# Patient Record
Sex: Female | Born: 1965 | Race: Black or African American | Hispanic: No
Health system: Southern US, Community
[De-identification: ages and names within clinical notes are randomized; demographics above are authoritative.]

---

## 2009-05-20 ENCOUNTER — Ambulatory Visit: Payer: Self-pay | Admitting: Family Medicine

## 2009-09-01 ENCOUNTER — Ambulatory Visit: Payer: Self-pay | Admitting: Family Medicine

## 2009-09-19 ENCOUNTER — Ambulatory Visit: Payer: Self-pay | Admitting: Family Medicine

## 2009-11-14 ENCOUNTER — Ambulatory Visit: Payer: Self-pay | Admitting: Orthopedic Surgery

## 2010-01-22 ENCOUNTER — Ambulatory Visit: Payer: Self-pay | Admitting: Orthopedic Surgery

## 2010-01-31 ENCOUNTER — Inpatient Hospital Stay: Payer: Self-pay | Admitting: Orthopedic Surgery

## 2010-03-07 ENCOUNTER — Ambulatory Visit: Payer: Self-pay | Admitting: General Surgery

## 2010-04-19 ENCOUNTER — Inpatient Hospital Stay: Payer: Self-pay | Admitting: Orthopedic Surgery

## 2010-05-16 ENCOUNTER — Encounter: Payer: Self-pay | Admitting: Orthopedic Surgery

## 2010-05-24 ENCOUNTER — Encounter: Payer: Self-pay | Admitting: Orthopedic Surgery

## 2010-09-03 ENCOUNTER — Ambulatory Visit: Payer: Self-pay | Admitting: Surgery

## 2010-09-04 LAB — PATHOLOGY REPORT

## 2010-10-29 ENCOUNTER — Ambulatory Visit: Payer: Self-pay | Admitting: Surgery

## 2010-12-11 ENCOUNTER — Ambulatory Visit: Payer: Self-pay | Admitting: Surgery

## 2010-12-21 ENCOUNTER — Ambulatory Visit: Payer: Self-pay | Admitting: Surgery

## 2010-12-24 LAB — PATHOLOGY REPORT

## 2011-01-07 ENCOUNTER — Ambulatory Visit: Payer: Self-pay | Admitting: Surgery

## 2011-01-17 ENCOUNTER — Ambulatory Visit: Payer: Self-pay

## 2011-06-20 ENCOUNTER — Ambulatory Visit: Payer: Self-pay | Admitting: Podiatry

## 2011-07-12 ENCOUNTER — Ambulatory Visit: Payer: Self-pay | Admitting: Podiatry

## 2011-09-03 ENCOUNTER — Ambulatory Visit: Payer: Self-pay | Admitting: Family Medicine

## 2011-09-08 IMAGING — CR DG SHOULDER 3+V*R*
1 series · 3 of 3 positions shown · non-contrast
Comparison: none

REASON FOR EXAM: right osteoarthrititis
COMMENTS:

PROCEDURE:     DXR - DXR SHOULDER RIGHT COMPLETE  - January 17, 2011 [DATE]
RESULT:     Images of the right shoulder demonstrate hypertrophic spurring.
There is no definite fracture, dislocation or foreign body.

[Series 1: view not recorded · 0.17mm/px · 3 of 3 slices shown]
[im 1/3]
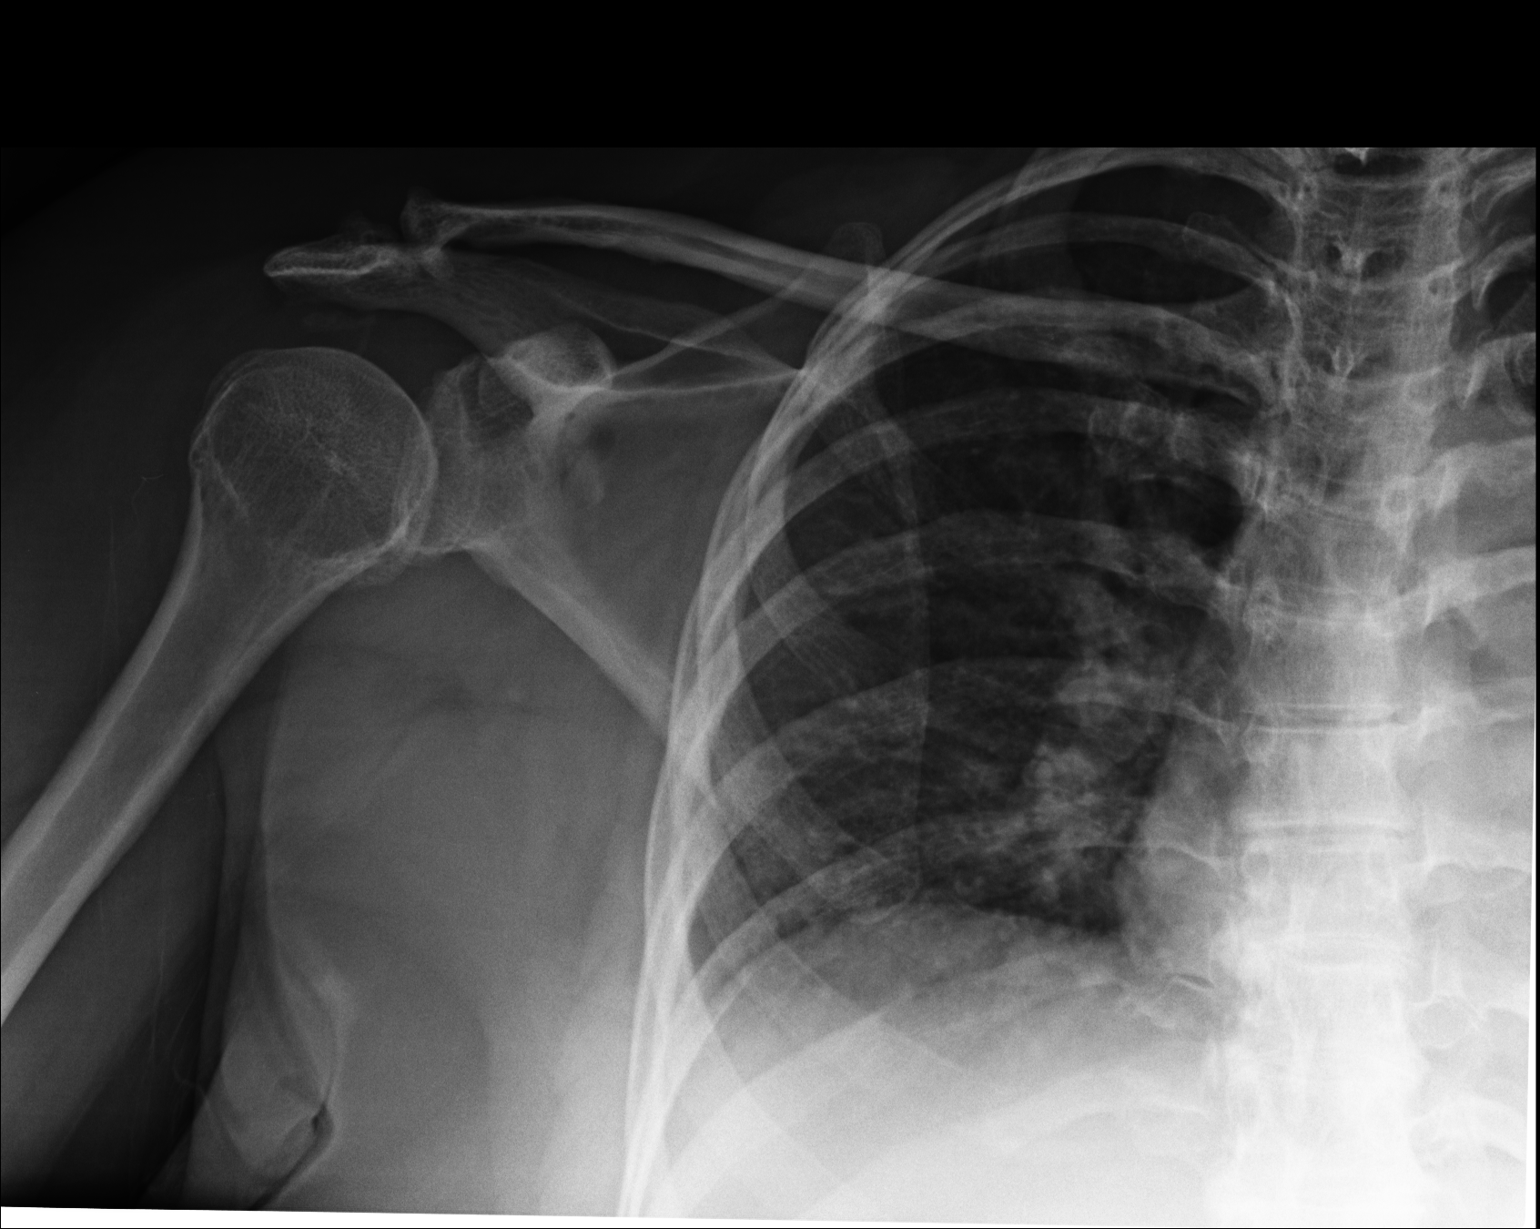
[im 2/3]
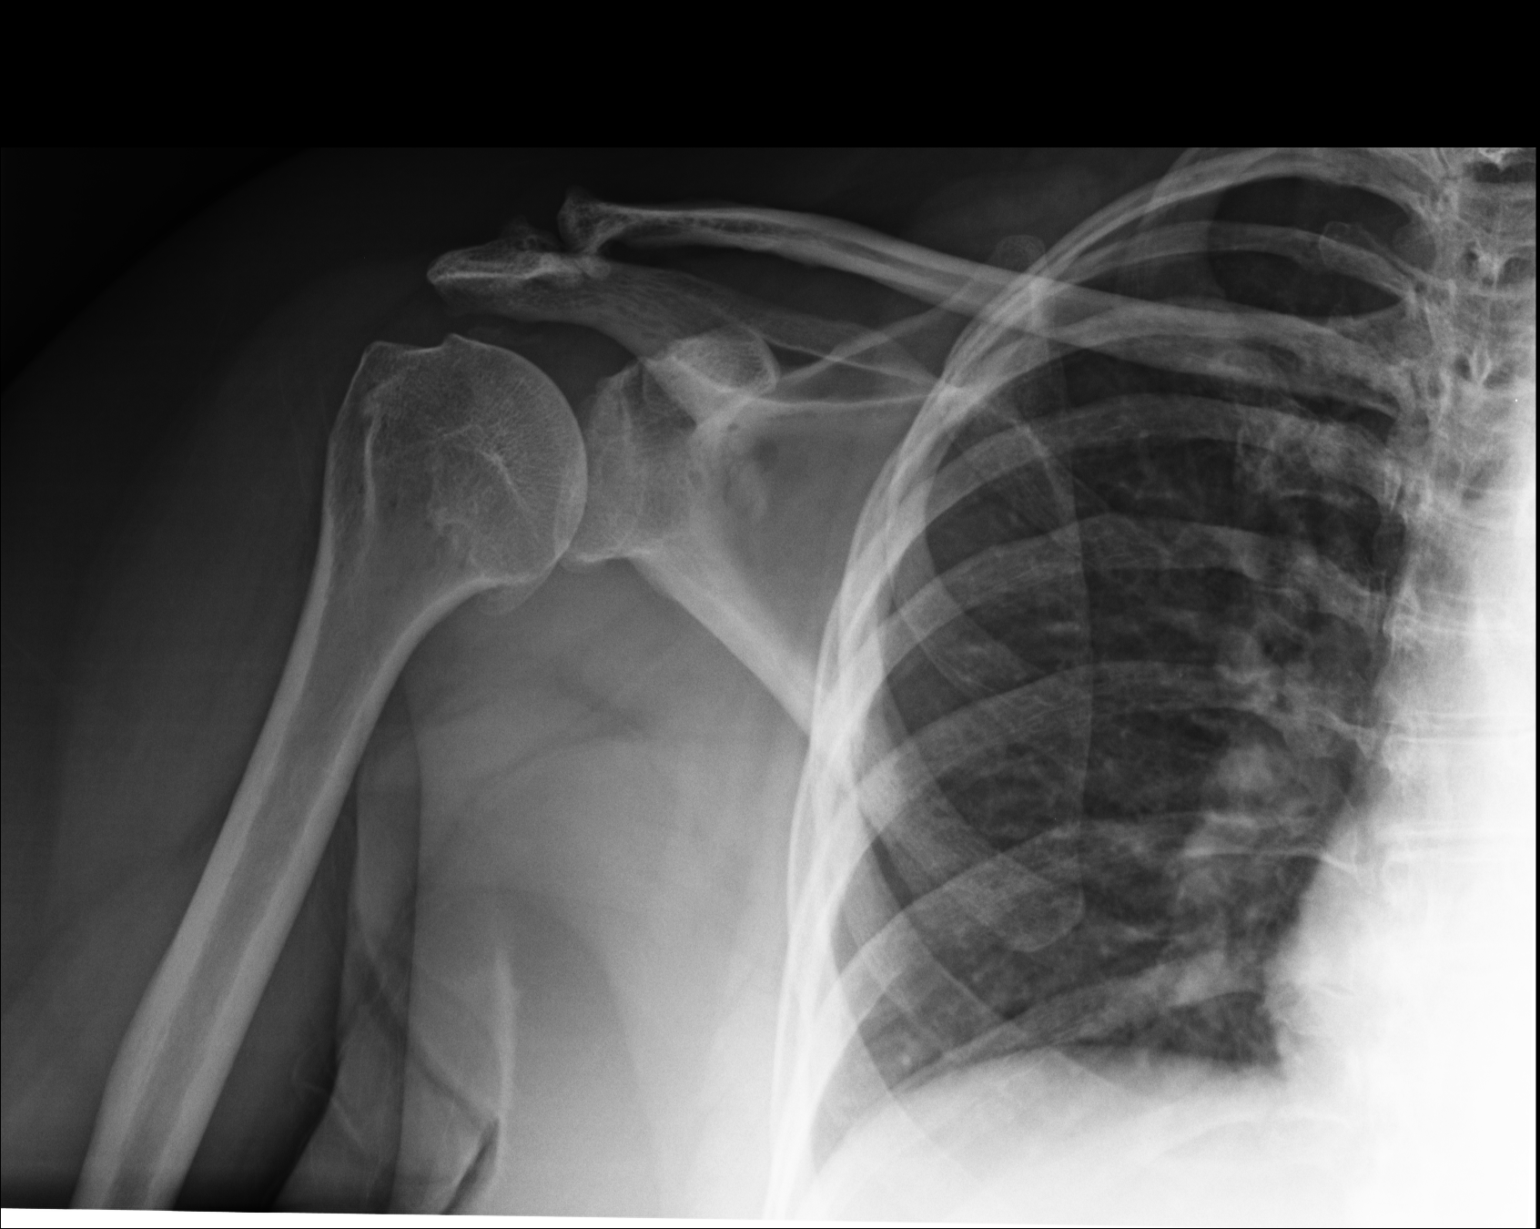
[im 3/3]
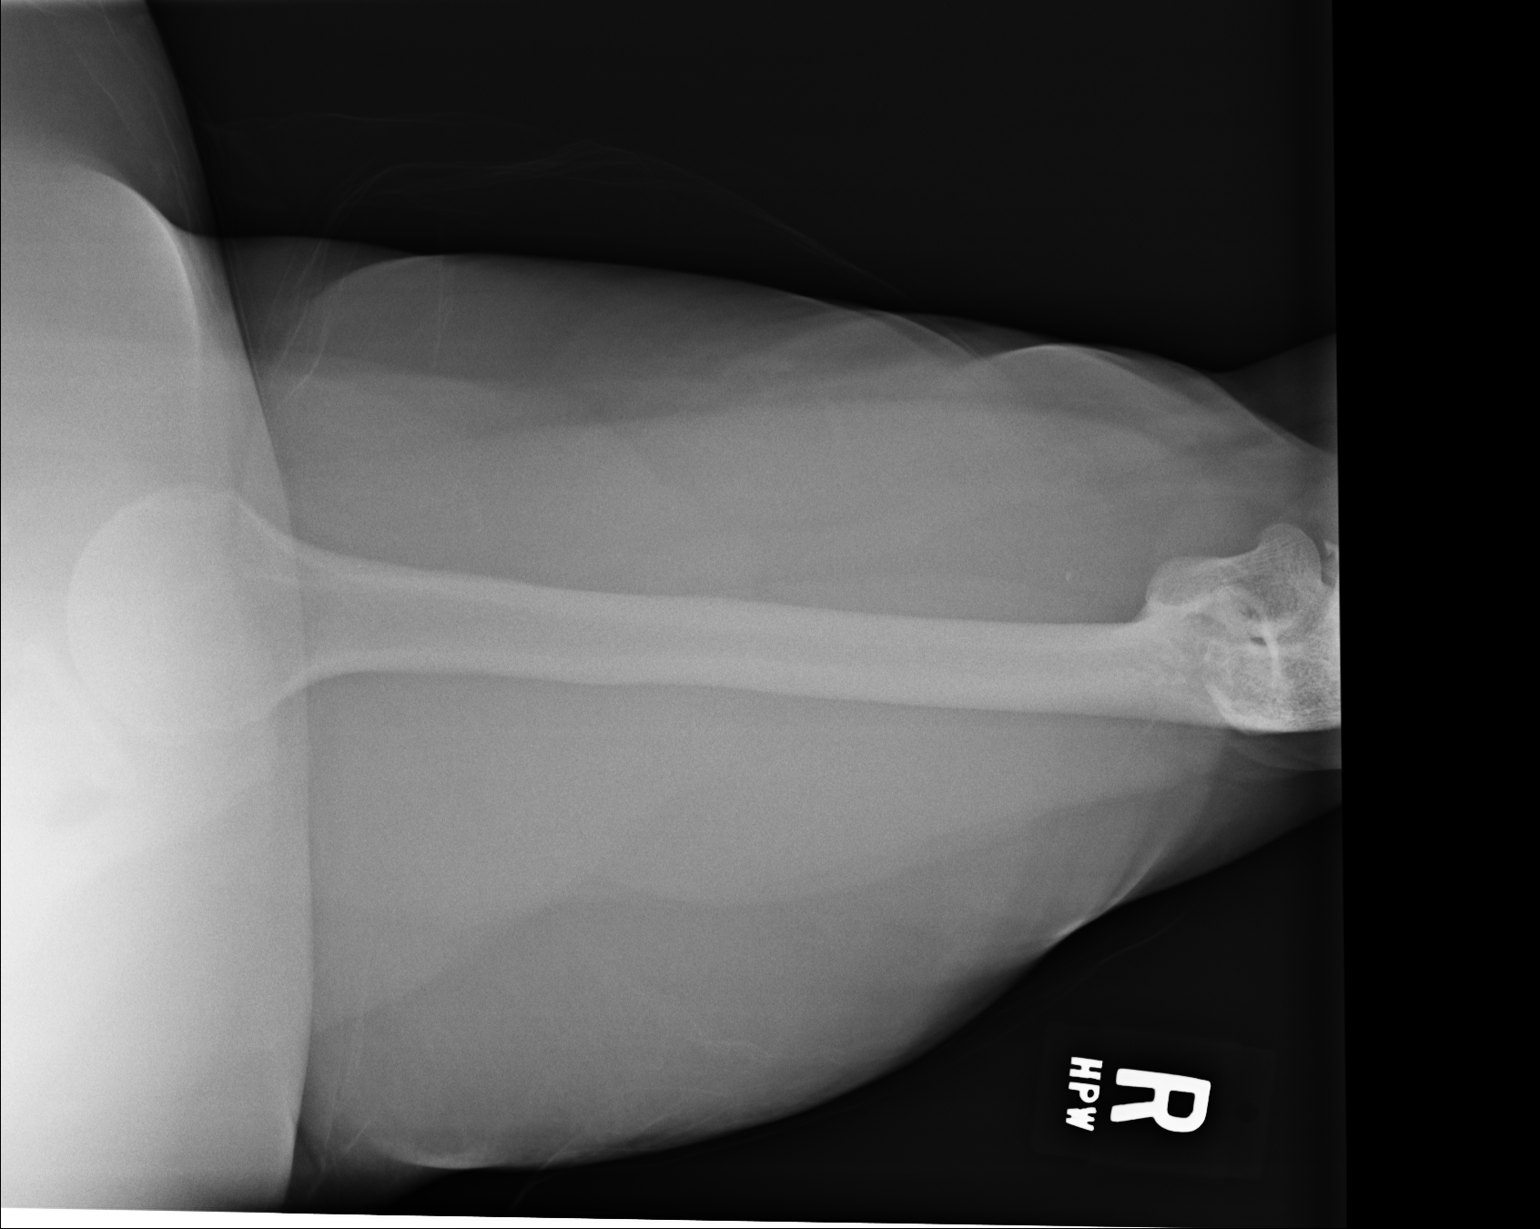

[3 of 3 positions shown; findings below may reference images not displayed]

IMPRESSION: Degenerative changes of the right shoulder and right
acromioclavicular joint.

## 2011-09-09 ENCOUNTER — Encounter: Payer: Self-pay | Admitting: Orthopedic Surgery

## 2011-09-24 ENCOUNTER — Encounter: Payer: Self-pay | Admitting: Orthopedic Surgery

## 2011-10-25 ENCOUNTER — Encounter: Payer: Self-pay | Admitting: Orthopedic Surgery

## 2011-10-28 ENCOUNTER — Other Ambulatory Visit: Payer: Self-pay | Admitting: Pain Medicine

## 2011-10-28 ENCOUNTER — Ambulatory Visit: Payer: Self-pay | Admitting: Pain Medicine

## 2011-10-28 LAB — SEDIMENTATION RATE: Erythrocyte Sed Rate: 42 mm/hr — ABNORMAL HIGH (ref 0–20)

## 2011-11-20 ENCOUNTER — Ambulatory Visit: Payer: Self-pay | Admitting: Pain Medicine

## 2011-11-22 ENCOUNTER — Encounter: Payer: Self-pay | Admitting: Orthopedic Surgery

## 2011-12-05 ENCOUNTER — Ambulatory Visit: Payer: Self-pay | Admitting: Pain Medicine

## 2011-12-11 ENCOUNTER — Ambulatory Visit: Payer: Self-pay | Admitting: Pain Medicine

## 2011-12-19 ENCOUNTER — Ambulatory Visit: Payer: Self-pay | Admitting: Pain Medicine

## 2011-12-23 ENCOUNTER — Encounter: Payer: Self-pay | Admitting: Orthopedic Surgery

## 2012-01-02 ENCOUNTER — Ambulatory Visit: Payer: Self-pay | Admitting: Family Medicine

## 2012-01-06 ENCOUNTER — Ambulatory Visit: Payer: Self-pay | Admitting: Pain Medicine

## 2012-01-09 ENCOUNTER — Ambulatory Visit: Payer: Self-pay | Admitting: Pain Medicine

## 2012-01-14 ENCOUNTER — Ambulatory Visit: Payer: Self-pay | Admitting: Family Medicine

## 2012-01-22 ENCOUNTER — Ambulatory Visit: Payer: Self-pay | Admitting: Family Medicine

## 2012-01-29 ENCOUNTER — Ambulatory Visit: Payer: Self-pay | Admitting: Pain Medicine

## 2012-01-29 ENCOUNTER — Other Ambulatory Visit: Payer: Self-pay | Admitting: Pain Medicine

## 2012-01-29 LAB — SEDIMENTATION RATE: Erythrocyte Sed Rate: 47 mm/hr — ABNORMAL HIGH (ref 0–20)

## 2012-02-03 ENCOUNTER — Ambulatory Visit: Payer: Self-pay | Admitting: Orthopedic Surgery

## 2012-02-03 LAB — BASIC METABOLIC PANEL
Anion Gap: 8 (ref 7–16)
Calcium, Total: 8.6 mg/dL (ref 8.5–10.1)
Chloride: 104 mmol/L (ref 98–107)
Co2: 26 mmol/L (ref 21–32)
Creatinine: 0.64 mg/dL (ref 0.60–1.30)
EGFR (African American): >60
Glucose: 120 mg/dL — ABNORMAL HIGH (ref 65–99)
Osmolality: 276 (ref 275–301)
Potassium: 4.4 mmol/L (ref 3.5–5.1)
Sodium: 138 mmol/L (ref 136–145)

## 2012-02-03 LAB — CBC
HCT: 35.9 % (ref 35.0–47.0)
HGB: 11.7 g/dL — ABNORMAL LOW (ref 12.0–16.0)
MCH: 27.2 pg (ref 26.0–34.0)
RBC: 4.32 10*6/uL (ref 3.80–5.20)
RDW: 21.6 % — ABNORMAL HIGH (ref 11.5–14.5)
WBC: 7 10*3/uL (ref 3.6–11.0)

## 2012-02-03 LAB — URINALYSIS, COMPLETE
Ketone: NEGATIVE
Protein: NEGATIVE
Specific Gravity: 1.025 (ref 1.003–1.030)

## 2012-02-03 LAB — PROTIME-INR
INR: 0.9
Prothrombin Time: 12.5 secs (ref 11.5–14.7)

## 2012-02-13 ENCOUNTER — Inpatient Hospital Stay: Payer: Self-pay | Admitting: Orthopedic Surgery

## 2012-02-13 LAB — URINALYSIS, COMPLETE
Bacteria: NONE SEEN
Ketone: NEGATIVE
Leukocyte Esterase: NEGATIVE
Nitrite: NEGATIVE
Ph: 5 (ref 4.5–8.0)
Protein: NEGATIVE
RBC,UR: 1 /HPF (ref 0–5)
Specific Gravity: 1.026 (ref 1.003–1.030)
Squamous Epithelial: 1

## 2012-02-13 LAB — PREGNANCY, URINE: Pregnancy Test, Urine: NEGATIVE m[IU]/mL

## 2012-02-14 LAB — CBC WITH DIFFERENTIAL/PLATELET
Basophil %: 0.3 %
Eosinophil %: 0.2 %
HGB: 10.4 g/dL — ABNORMAL LOW (ref 12.0–16.0)
Lymphocyte %: 15.2 %
MCH: 26.7 pg (ref 26.0–34.0)
MCHC: 31.7 g/dL — ABNORMAL LOW (ref 32.0–36.0)
MCV: 84 fL (ref 80–100)
Monocyte #: 0.8 x10 3/mm (ref 0.2–0.9)
Monocyte %: 9.4 %
Platelet: 149 10*3/uL — ABNORMAL LOW (ref 150–440)
RBC: 3.89 10*6/uL (ref 3.80–5.20)
RDW: 20.6 % — ABNORMAL HIGH (ref 11.5–14.5)

## 2012-02-14 LAB — URINE CULTURE

## 2012-02-14 LAB — BASIC METABOLIC PANEL
BUN: 6 mg/dL — ABNORMAL LOW (ref 7–18)
Calcium, Total: 7.5 mg/dL — ABNORMAL LOW (ref 8.5–10.1)
Chloride: 102 mmol/L (ref 98–107)
Co2: 29 mmol/L (ref 21–32)
Creatinine: 0.59 mg/dL — ABNORMAL LOW (ref 0.60–1.30)
EGFR (African American): >60
EGFR (Non-African Amer.): >60
Glucose: 143 mg/dL — ABNORMAL HIGH (ref 65–99)
Osmolality: 278 (ref 275–301)
Potassium: 3.8 mmol/L (ref 3.5–5.1)
Sodium: 139 mmol/L (ref 136–145)

## 2012-02-15 LAB — CBC WITH DIFFERENTIAL/PLATELET
Basophil #: 0.1 10*3/uL (ref 0.0–0.1)
Eosinophil #: 0.1 10*3/uL (ref 0.0–0.7)
Eosinophil %: 1.3 %
HCT: 29.6 % — ABNORMAL LOW (ref 35.0–47.0)
Lymphocyte #: 1.6 10*3/uL (ref 1.0–3.6)
MCHC: 32.6 g/dL (ref 32.0–36.0)
MCV: 83 fL (ref 80–100)
Monocyte #: 0.9 x10 3/mm (ref 0.2–0.9)
Monocyte %: 10 %
Platelet: 143 10*3/uL — ABNORMAL LOW (ref 150–440)
RBC: 3.55 10*6/uL — ABNORMAL LOW (ref 3.80–5.20)

## 2012-02-16 LAB — CBC WITH DIFFERENTIAL/PLATELET
Basophil #: 0 10*3/uL (ref 0.0–0.1)
Eosinophil #: 0.1 10*3/uL (ref 0.0–0.7)
Eosinophil %: 1.2 %
HGB: 9.2 g/dL — ABNORMAL LOW (ref 12.0–16.0)
Lymphocyte %: 17 %
MCV: 83 fL (ref 80–100)
Neutrophil %: 72.3 %
Platelet: 151 10*3/uL (ref 150–440)
RBC: 3.38 10*6/uL — ABNORMAL LOW (ref 3.80–5.20)
WBC: 7.8 10*3/uL (ref 3.6–11.0)

## 2012-02-18 LAB — CREATININE, SERUM
Creatinine: 0.64 mg/dL (ref 0.60–1.30)
EGFR (African American): >60

## 2012-02-22 ENCOUNTER — Ambulatory Visit: Payer: Self-pay | Admitting: Family Medicine

## 2012-03-04 ENCOUNTER — Encounter: Payer: Self-pay | Admitting: Orthopedic Surgery

## 2012-03-23 ENCOUNTER — Encounter: Payer: Self-pay | Admitting: Orthopedic Surgery

## 2012-04-23 ENCOUNTER — Encounter: Payer: Self-pay | Admitting: Orthopedic Surgery

## 2012-04-28 ENCOUNTER — Ambulatory Visit: Payer: Self-pay | Admitting: Family Medicine

## 2012-05-06 ENCOUNTER — Ambulatory Visit: Payer: Self-pay | Admitting: Pain Medicine

## 2012-05-24 ENCOUNTER — Encounter: Payer: Self-pay | Admitting: Orthopedic Surgery

## 2012-05-24 ENCOUNTER — Ambulatory Visit: Payer: Self-pay | Admitting: Family Medicine

## 2012-05-28 ENCOUNTER — Ambulatory Visit: Payer: Self-pay | Admitting: Pain Medicine

## 2012-06-18 ENCOUNTER — Ambulatory Visit: Payer: Self-pay | Admitting: Podiatry

## 2012-09-11 ENCOUNTER — Ambulatory Visit: Payer: Self-pay | Admitting: Family Medicine

## 2012-10-05 ENCOUNTER — Encounter: Payer: Self-pay | Admitting: Podiatry

## 2012-10-24 ENCOUNTER — Encounter: Payer: Self-pay | Admitting: Podiatry

## 2012-11-26 ENCOUNTER — Ambulatory Visit: Payer: Self-pay | Admitting: Podiatry

## 2012-12-16 ENCOUNTER — Ambulatory Visit: Payer: Self-pay | Admitting: Gastroenterology

## 2012-12-17 ENCOUNTER — Ambulatory Visit: Payer: Self-pay | Admitting: Family Medicine

## 2012-12-22 ENCOUNTER — Ambulatory Visit: Payer: Self-pay | Admitting: Family Medicine

## 2012-12-23 ENCOUNTER — Ambulatory Visit: Payer: Self-pay | Admitting: Family Medicine

## 2013-01-21 ENCOUNTER — Ambulatory Visit: Payer: Self-pay | Admitting: Family Medicine

## 2013-03-15 ENCOUNTER — Ambulatory Visit: Payer: Self-pay | Admitting: Pain Medicine

## 2013-04-16 ENCOUNTER — Ambulatory Visit: Payer: Self-pay | Admitting: Physician Assistant

## 2013-05-18 ENCOUNTER — Ambulatory Visit: Payer: Self-pay | Admitting: Podiatry

## 2013-06-30 ENCOUNTER — Ambulatory Visit: Payer: Self-pay

## 2013-06-30 ENCOUNTER — Ambulatory Visit: Payer: Self-pay | Admitting: Gastroenterology

## 2013-08-14 IMAGING — CT CT HEAD WITHOUT CONTRAST
1 series · 16 of 30 positions shown, 20 images · non-contrast
Comparison: none

REASON FOR EXAM: HA
COMMENTS:

[Series 2: soft tissue · axial · 0.41mm/px · z∈[-26,+109]mm · 16 of 31 slices shown, 20 images]
[im 2/31  brain]
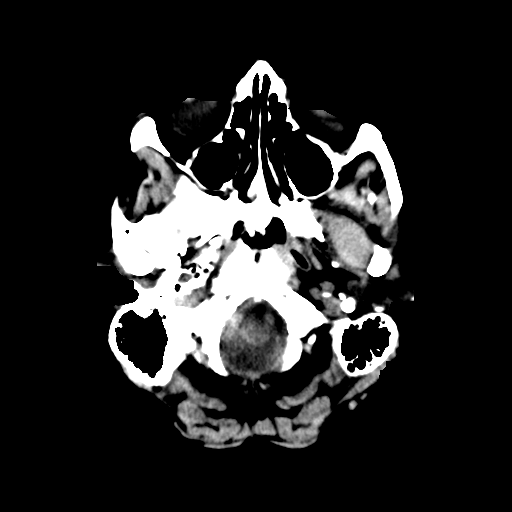
[im 2/31  bone]
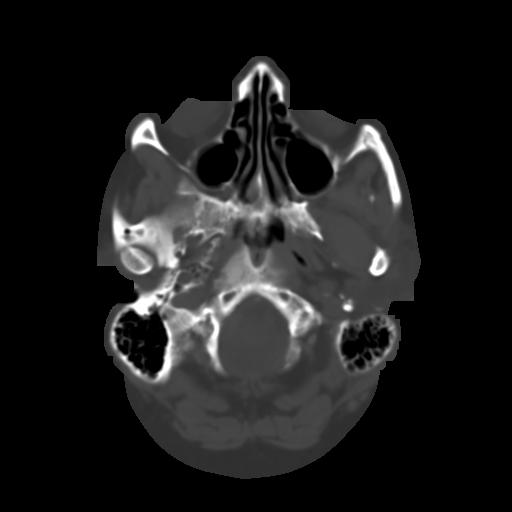
[im 4/31  brain]
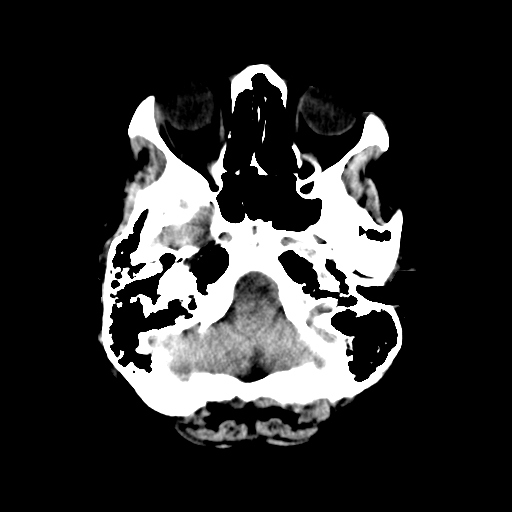
[im 6/31  brain]
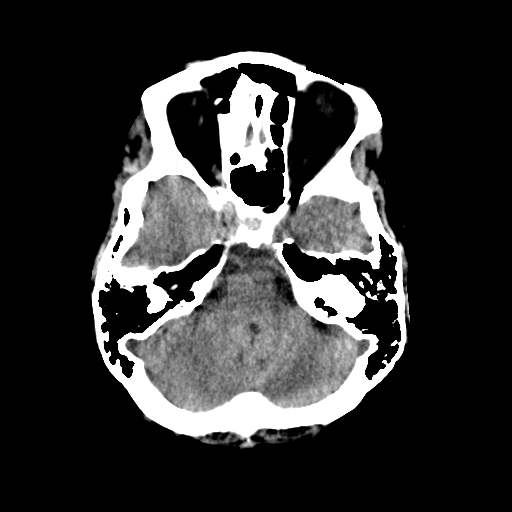
[im 8/31  brain]
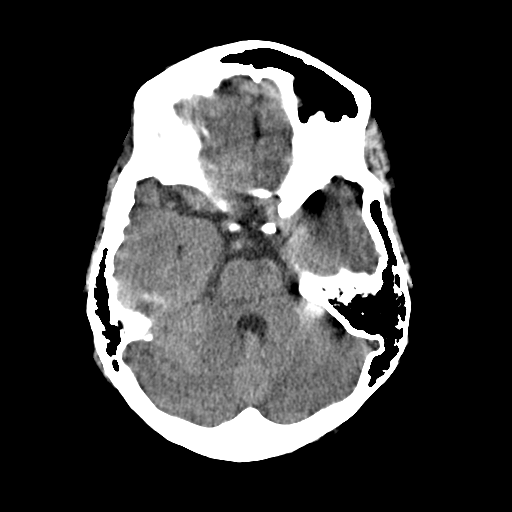
[im 9/31  brain]
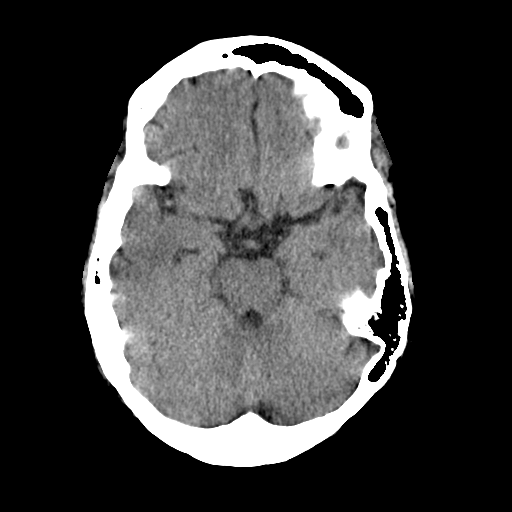
[im 9/31  bone]
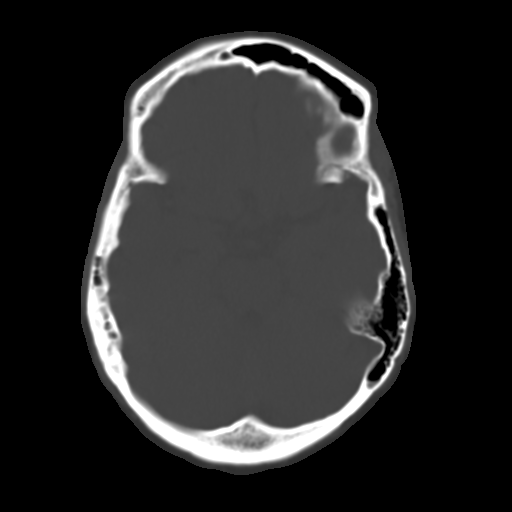
[im 11/31  brain]
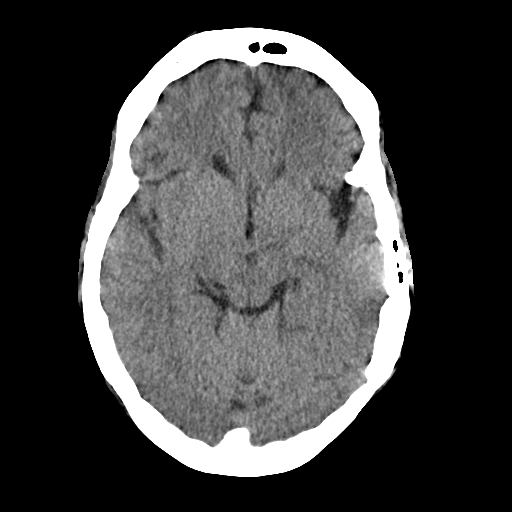
[im 13/31  brain]
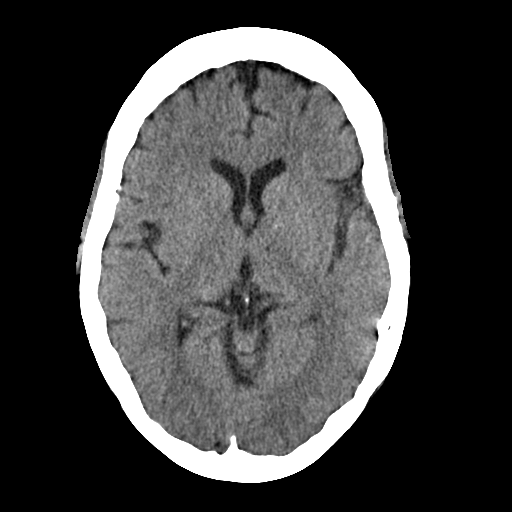
[im 15/31  brain]
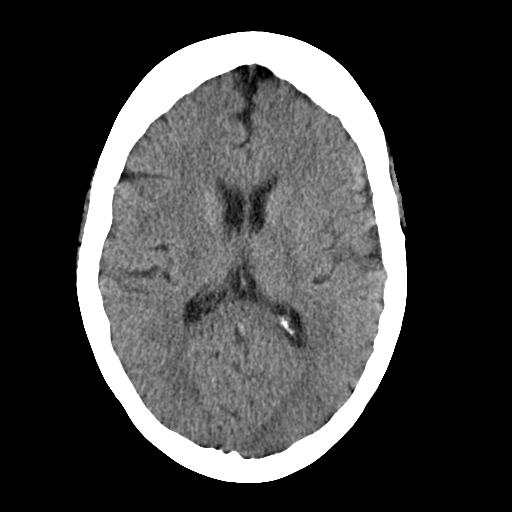
[im 16/31  brain]
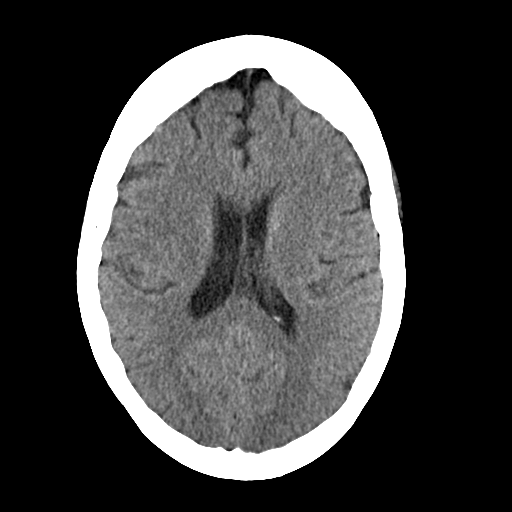
[im 16/31  bone]
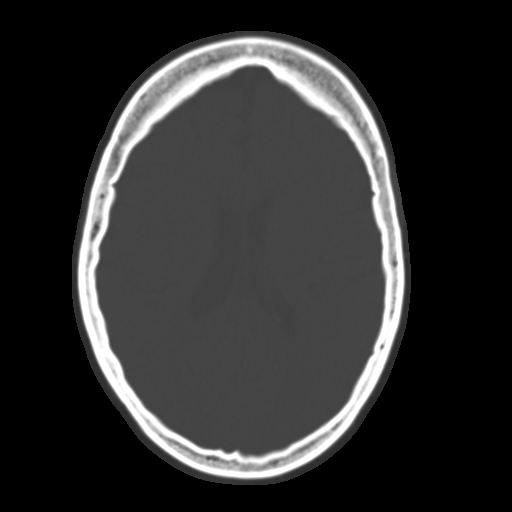
[im 18/31  brain]
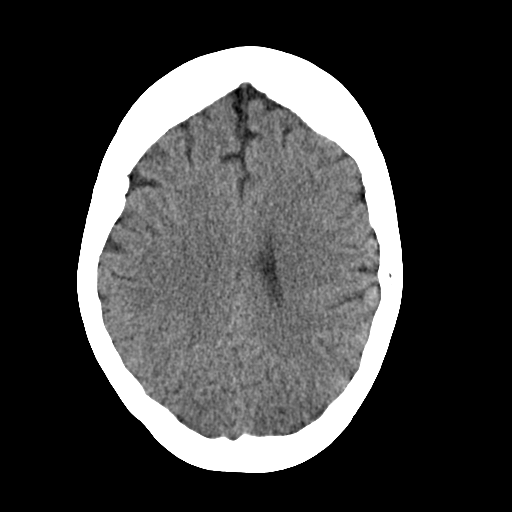
[im 20/31  brain]
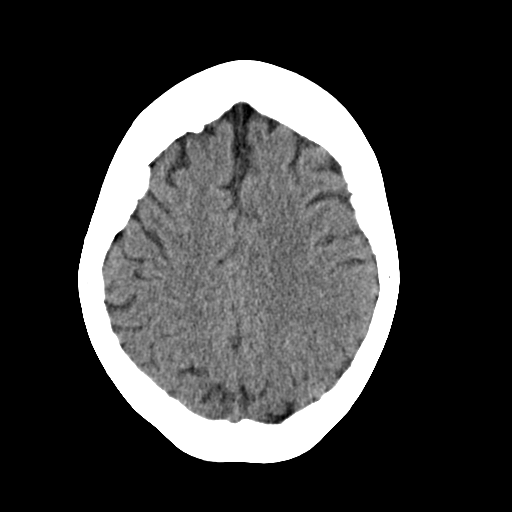
[im 22/31  brain]
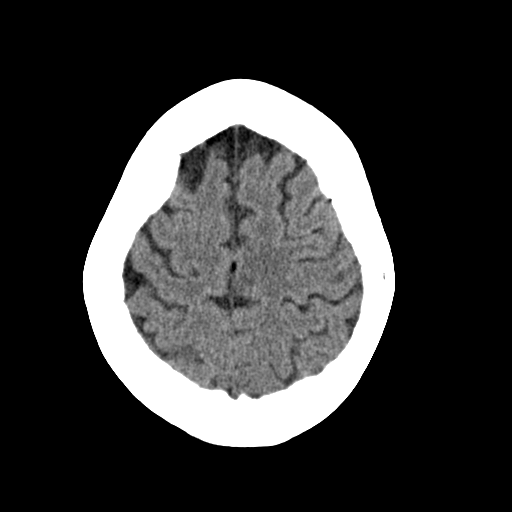
[im 23/31  brain]
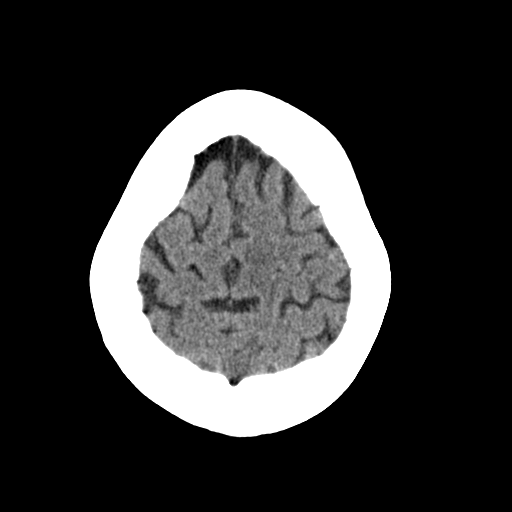
[im 23/31  bone]
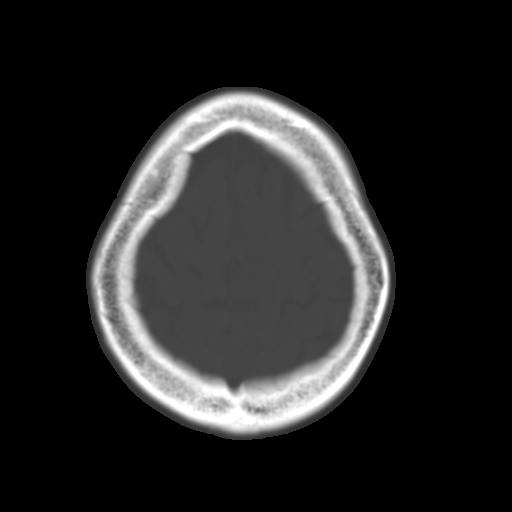
[im 25/31  brain]
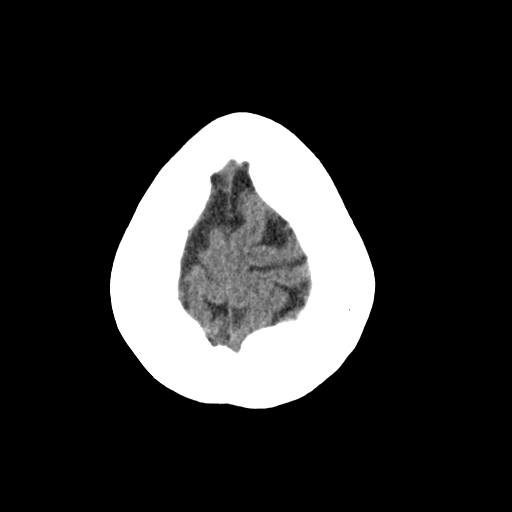
[im 27/31  brain]
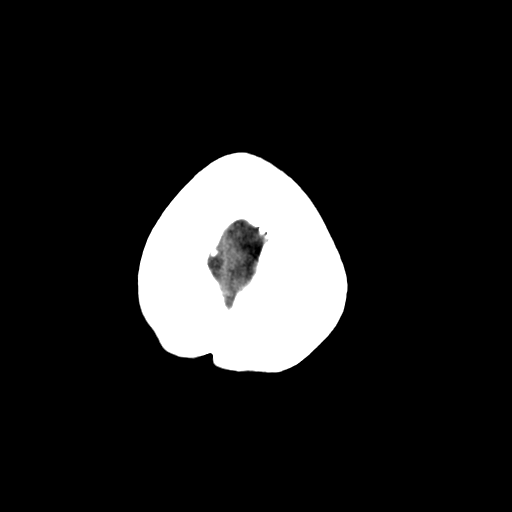
[im 29/31  brain]
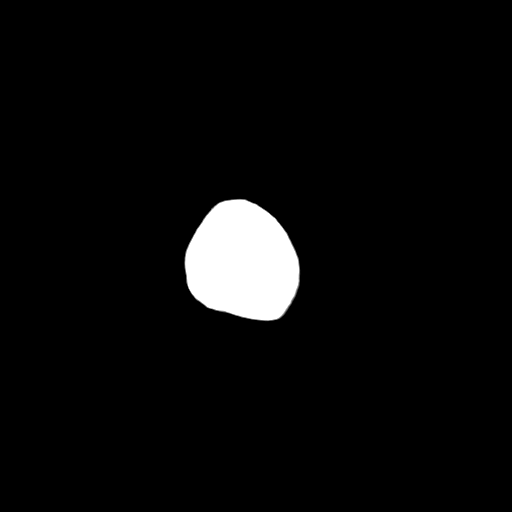

[16 of 30 positions shown; findings below may reference images not displayed]

PROCEDURE:     CARNES - WING CHEUNG BOHOL WITHOUT CONTRAST  - December 23, 2012  [DATE]

RESULT:     Axial noncontrast CT scanning was performed through the brain
with reconstructions at 5 mm intervals and slice thicknesses.

The ventricles are normal in size and position. There is no intracranial
hemorrhage nor intracranial mass effect. There are punctate basal ganglia
calcifications bilaterally. The cerebellum and brainstem are normal in
density. At bone window settings the observed portions of the paranasal
sinuses and mastoid air cells are clear. There is no lytic or blastic bony
lesion.
IMPRESSION: There is no acute intracranial abnormality.

An attempt was made by the to call this report at the time of dictation as

[REDACTED]

## 2013-08-24 ENCOUNTER — Ambulatory Visit: Payer: Self-pay | Admitting: Podiatry

## 2013-08-24 LAB — BASIC METABOLIC PANEL
Anion Gap: 6 — ABNORMAL LOW (ref 7–16)
Calcium, Total: 9.2 mg/dL (ref 8.5–10.1)
Chloride: 105 mmol/L (ref 98–107)
EGFR (African American): >60
EGFR (Non-African Amer.): >60
Glucose: 90 mg/dL (ref 65–99)
Osmolality: 275 (ref 275–301)
Potassium: 4 mmol/L (ref 3.5–5.1)
Sodium: 139 mmol/L (ref 136–145)

## 2013-08-24 LAB — CBC WITH DIFFERENTIAL/PLATELET
Eosinophil #: 0.1 10*3/uL (ref 0.0–0.7)
Eosinophil %: 0.7 %
HCT: 34.6 % — ABNORMAL LOW (ref 35.0–47.0)
HGB: 10.9 g/dL — ABNORMAL LOW (ref 12.0–16.0)
Lymphocyte #: 2.3 10*3/uL (ref 1.0–3.6)
Lymphocyte %: 28.5 %
MCH: 23.6 pg — ABNORMAL LOW (ref 26.0–34.0)
MCHC: 31.4 g/dL — ABNORMAL LOW (ref 32.0–36.0)
Monocyte #: 0.5 x10 3/mm (ref 0.2–0.9)
Monocyte %: 6.7 %
Neutrophil #: 5.2 10*3/uL (ref 1.4–6.5)
Neutrophil %: 63.7 %
Platelet: 247 10*3/uL (ref 150–440)
RBC: 4.61 10*6/uL (ref 3.80–5.20)
RDW: 21.7 % — ABNORMAL HIGH (ref 11.5–14.5)
WBC: 8.1 10*3/uL (ref 3.6–11.0)

## 2013-08-24 LAB — HEMOGLOBIN A1C: Hemoglobin A1C: 7.2 % — ABNORMAL HIGH (ref 4.2–6.3)

## 2013-08-24 LAB — HCG, QUANTITATIVE, PREGNANCY: Beta Hcg, Quant.: 1 m[IU]/mL

## 2013-08-27 ENCOUNTER — Ambulatory Visit: Payer: Self-pay | Admitting: Podiatry

## 2013-09-02 LAB — PATHOLOGY REPORT

## 2014-01-07 IMAGING — US US EXTREM LOW VENOUS*L*
1 series · 14 of 24 positions shown · non-contrast
Comparison: none

REASON FOR EXAM: Call Report [id number]  Dr Cell Eval for DVT   swelling
redness  pain
COMMENTS:

[Series 1: us extrem low venous*left* · 0.11mm/px · 14 of 25 slices shown]
[im 1/25]
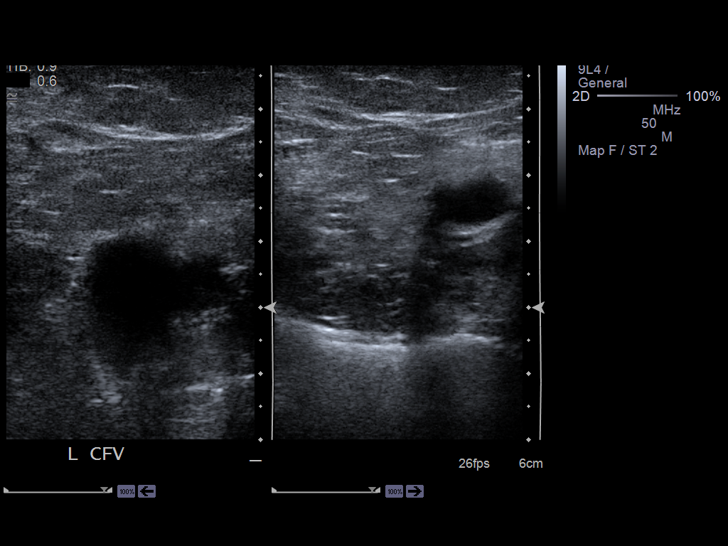
[im 3/25]
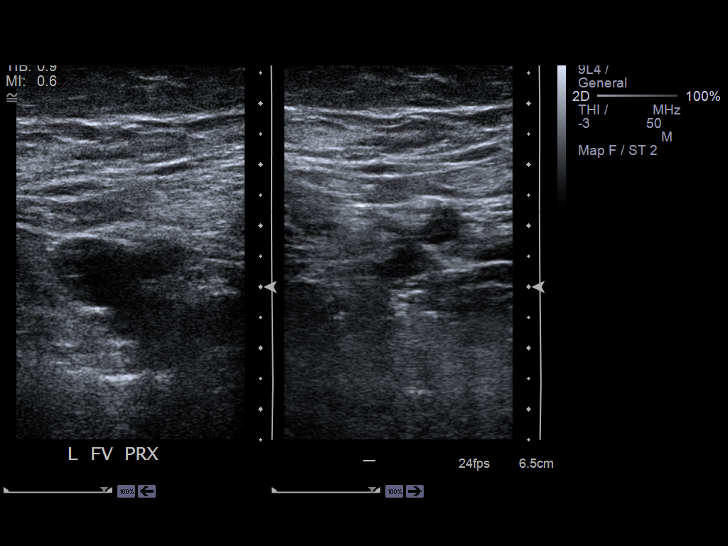
[im 5/25]
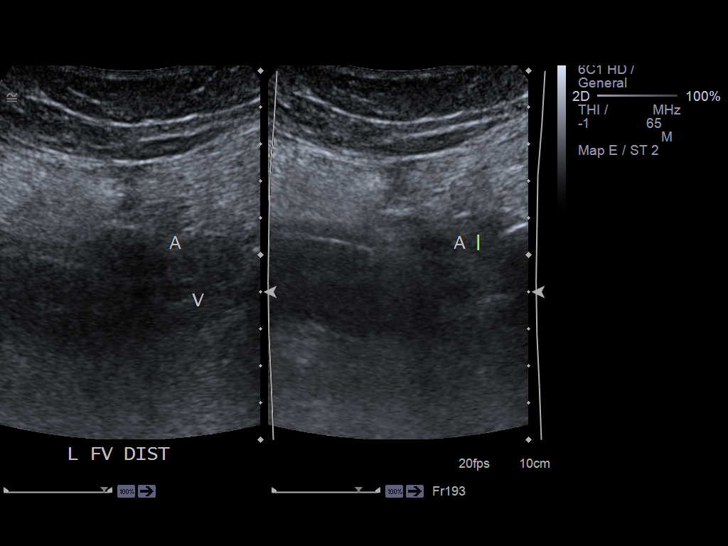
[im 7/25]
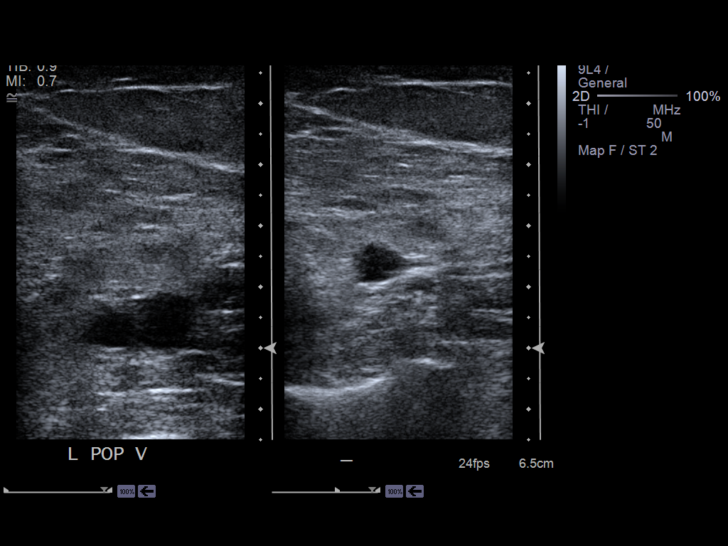
[im 8/25]
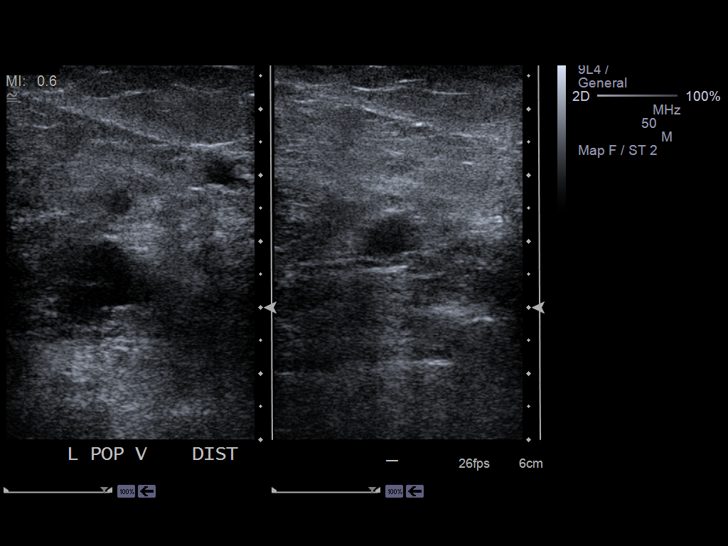
[im 10/25]
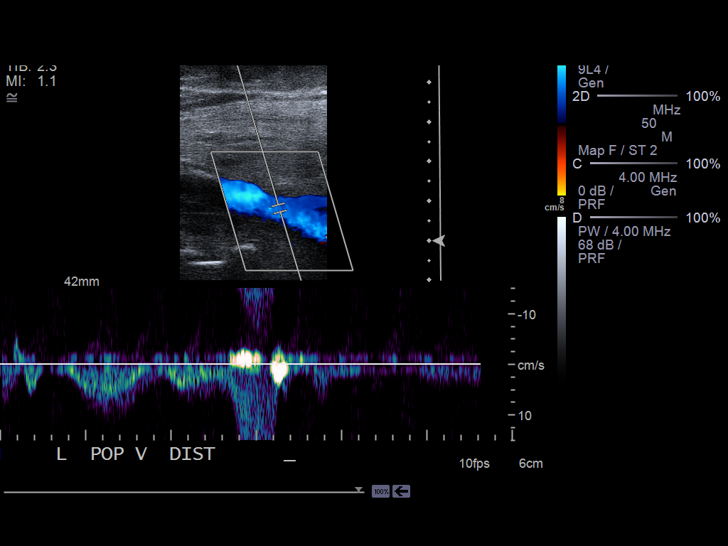
[im 12/25]
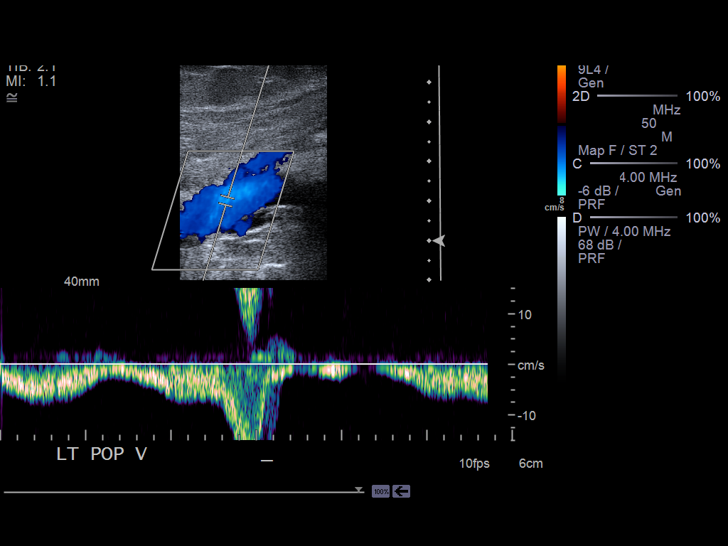
[im 13/25]
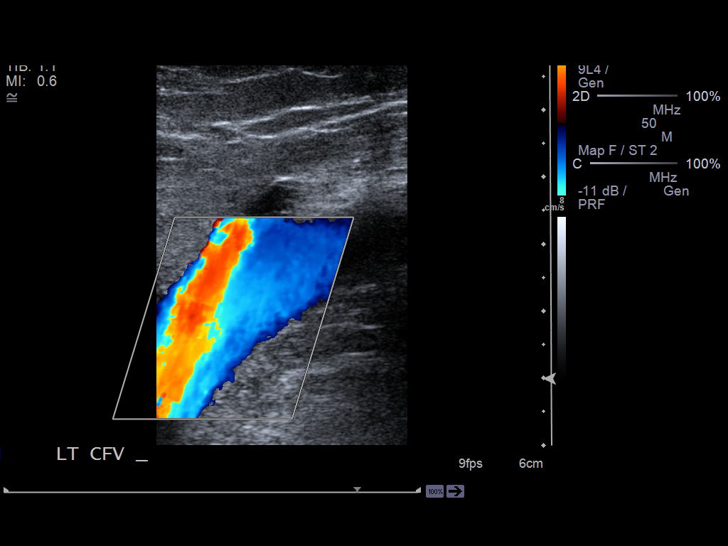
[im 15/25]
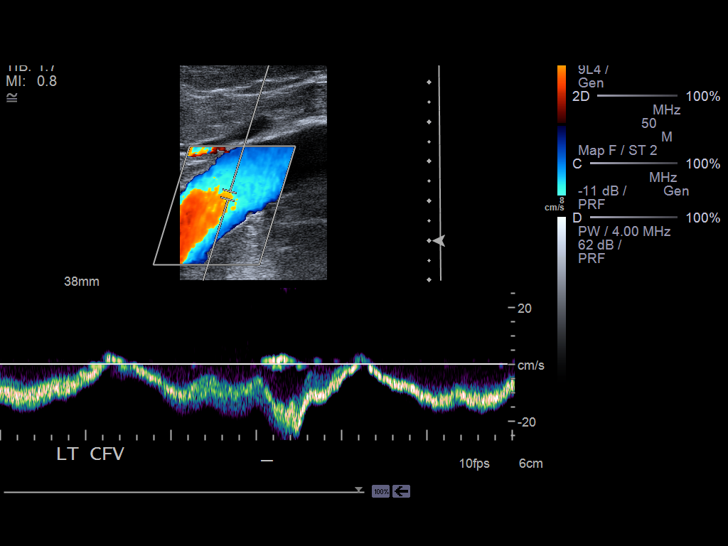
[im 17/25]
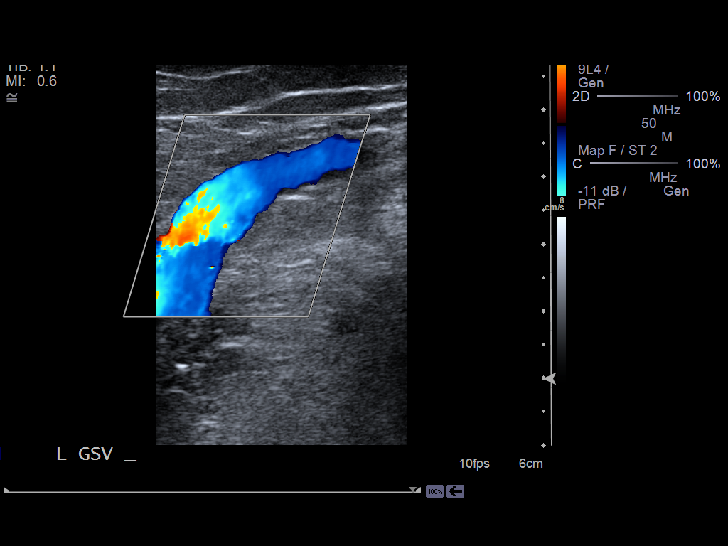
[im 19/25]
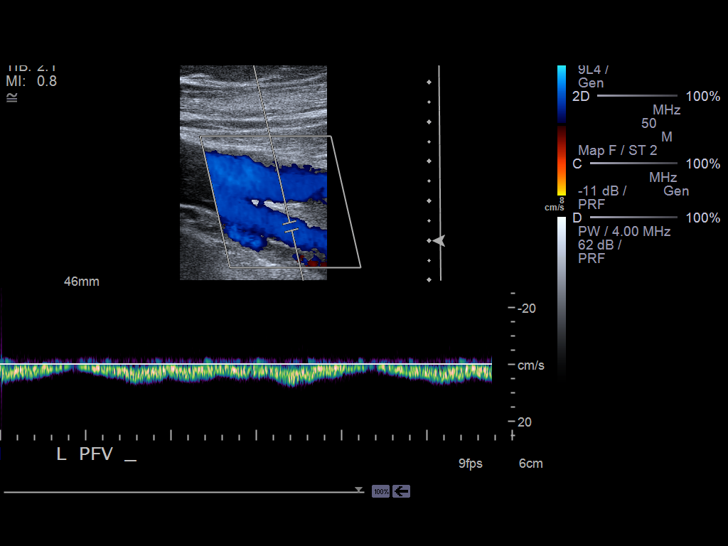
[im 20/25]
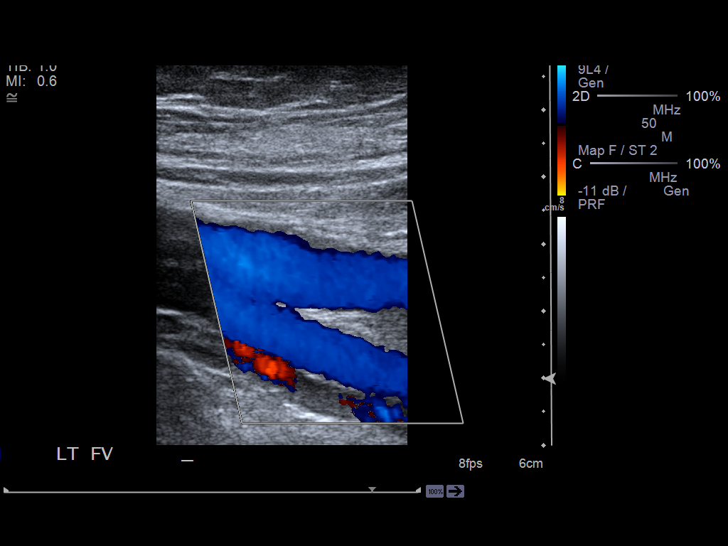
[im 22/25]
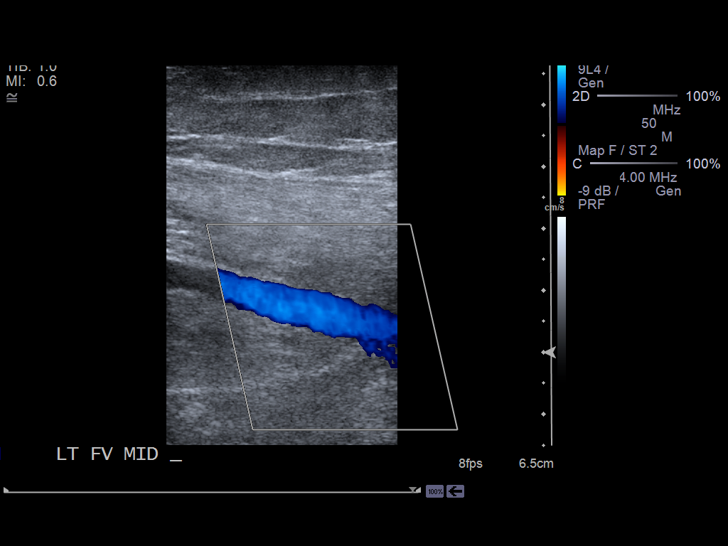
[im 25/25]
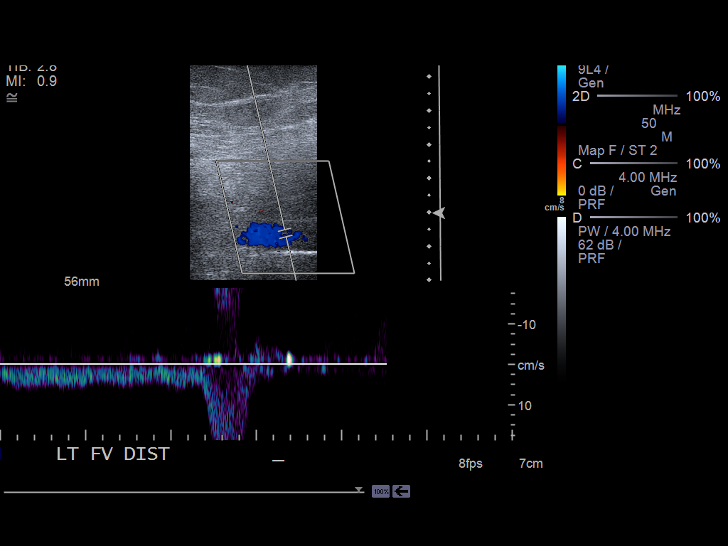

[14 of 24 positions shown; findings below may reference images not displayed]

PROCEDURE:     US  - US DOPPLER LOW EXTR LEFT  - May 18, 2013  [DATE]

RESULT:     Grayscale and color flow Doppler techniques were employed to
evaluate the deep veins of the left lower extremity.

The left common femoral, superficial femoral, and popliteal veins are
normally compressible. The waveform patterns are normal and the color flow
images are normal. The response to the augmentation and Valsalva maneuvers
is normal.
IMPRESSION: There is no evidence of thrombus within the left femoral or
popliteal veins.

[REDACTED]

## 2015-01-13 NOTE — Op Note (Signed)
PATIENT NAME:  Kelsey Paul, Kelsey Paul MR#:  [id number] DATE OF BIRTH:  12/09/1965  DATE OF SURGERY:  08/27/2013  PREOPERATIVE DIAGNOSIS:  Left ankle arthritis, end-stage.   POSTOPERATIVE DIAGNOSIS:  Left ankle arthritis, end-stage.   PROCEDURE PERFORMED:  Left ankle arthrodesis.   SURGEON:  Gentry Seeber A. Ether Griffins, DPM.   ANESTHESIA:  General with popliteal block   ASSISTANT:  Dr. Orland Jarred.   HEMOSTASIS:  A thigh tourniquet inflated to 325 mmHg for 125 minutes.   IMPLANTS:  Three OrthoHelix screws and an external bone stimulator.   OPERATIVE INDICATIONS:  This is a 49 year old female who has developed end-stage degenerative arthritis into her left ankle. She has undergone surgeries on her left foot. We have undergone conservative treatment and she presents today for surgery. All risks, benefits, alternatives and complications associated with surgery were discussed with the patient and full informed consent has been given.   OPERATIVE PROCEDURE:  The patient was brought into the OR and placed in a posterior the supine position. General intubation was administered by the anesthesia team after placement of a popliteal block. The left lower extremity was then prepped and draped in the usual sterile fashion. Attention was initially directed to the anterolateral aspect of the ankle joint where a longitudinal incision was made overlying the distal fibula and curved anterior across the TN joint region. Sharp and blunt dissection was carried down to the capsule and periosteum. At this time subperiosteal dissection was undertaken across the lateral aspect and as well as into the anterior aspect of the ankle joint. Good exposure of the lateral and central portion of the ankle joint was noted with this. Attention was then directed medially where a medial accessory incision was made over the medial gutter. Sharp and blunt dissection was carried down to the capsule. Subperiosteal and capsular dissection was then undertaken  exposing the medial gutter of the ankle joint. Next, with use of a combination of curettage, smoothed burs, I was able to remove all of the residual minimal articular cartilage. She had noted severe end-stage arthritic changes throughout the ankle joint. There was mild residual articular cartilage on the medial gutter. The lateral one-third basically had minimal articular cartilage with obvious areas of bleeding bone in the region. After further contouring with the 4-mm bur, a 2.0-mm screw was then used to break through the subchondral drill plate. Multiple holes were placed, both in the tibial and talar side as well as into the fibular region. Initial provisional fixation was then performed for the 7.0-mm OrthoHelix screws. A proximal to distal screw was placed from the lateral tibia into the talus. Good alignment was noted, and this seemed to exit just on the medial aspect of the distal medial talus. A second screw was placed from proximal tibia medially crossing the ankle joint into the talus laterally. Good compression was noted with both of these. A third screw was placed to the medial malleolus into the talus from a posterior medial to an anterior lateral position. Screw lengths were 47.5, 55 and 45, and all were 7.0-mm screws. Just prior to placement of the screws, 5 mm bone putty was placed into the ankle joint. A small bone aspirate was taken from the calcaneus with a trocar and cannula. This was mixed into the bone putty. Good compression was noted, good stability was noted. The second ray was in line with the tibial crest and the foot was held in at a 90 degrees position to the leg. The wound was flushed with copious amounts  of irrigation. Layered closure was then performed with 2-0 and 3-0 Vicryl for the deeper layers and skin staples for the skin. The tourniquet was dropped, and all bleeders were Bovie cauterized. Minimal bleeding was noted with just mild skin oozing as expected. A well compressive  sterile dressing was placed after a combination of 20 mL of 0.5% Marcaine with epinephrine and 10 mL of Marcaine plain. The patient was then placed in a posterior splint with a compressive dressing. She tolerated the procedure and anesthesia well. She was transported from the OR to the PAC-U with all vital signs stable and neurovascular status intact. She will be admitted to the floor for observation and postop pain control. She will be seen tomorrow for discharge. We will check on her throughout the evening as needed   ____________________________ Kelsey Paul, DPM jaf:jm D: 08/27/2013 11:14:53 ET T: 08/27/2013 11:59:13 ET JOB#: [id number]  cc: Kelsey Paul, DPM, <Dictator> Denzil Mceachron DPM ELECTRONICALLY SIGNED 09/08/2013 13:17

## 2015-01-13 NOTE — Discharge Summary (Signed)
Dates of Admission and Diagnosis:  Date of Admission 27-Aug-2013   Date of Discharge 28-Aug-2013   Admitting Diagnosis End stage arthritis left ankle   Final Diagnosis Same   Discharge Diagnosis 1 Diabetes    Chief Complaint/History of Present Illness 49 y.o. female with chronic arthritis left ankle, had ankle arthrodesis performed yesterday. Overnight observation for pain management.   PERTINENT RADIOLOGY STUDIES: Fluoroscopy:    05-Dec-14 10:42, C-Arm with 2 Views Ankle Left  C-Arm with 2 Views Ankle Left   REASON FOR EXAM:    Fracture reduction/fixation  COMMENTS:       PROCEDURE: DXR - DXR C-ARM WITH 2 VIEWS LT ANKLE  - Aug 27 2013 10:42AM     CLINICAL DATA:  Open reduction internal fixation ankle fracture.    EXAM:  DG C-ARM 1-60 MIN    FLUOROSCOPY TIME:  39 min 3 seconds    FINDINGS:  C-arm fluoroscopic images were obtained intraoperatively and  submitted for post operative interpretation. Please see the  performing provider's procedural report for the fluoroscopy time  utilized.     IMPRESSION:  Open reduction internal fixation left ankle fracture      Electronically Signed    By: Salome HolmesHector  Cooper M.D.    On: 08/27/2013 10:48         Verified By: Jani FilesHECTOR W. COOPER, M.D., MD   Hospital Course:  Hospital Course Surgery on 08-27-13.  Afebrile overnight, c/o severe pain in ankle, helped with pca pump.  Blood sugars mildly  elevated.  Bandage dry and intact.   Condition on Discharge Satisfactory   DISCHARGE INSTRUCTIONS HOME MEDS:  Medication Reconciliation: Patient's Home Medications at Discharge:     Medication Instructions  metformin 1000 mg oral tablet  1 tab(s) orally 2 times a day   proair hfa cfc free 90 mcg/inh inhalation aerosol  2 puff(s) inhaled every 4 hours, As Needed - for Shortness of Breath, for Wheezing    qvar 80 mcg/inh inhalation aerosol  2 puff(s) inhaled 2 times a day   duloxetine 60 mg oral delayed release capsule  1 cap(s) orally 2  times a day   clonazepam 1 mg oral tablet  2 tab(s) orally once a day (at bedtime)   trazodone 100 mg oral tablet  1 tab(s) orally once a day (at bedtime)   metoclopramide 5 mg oral tablet  1 tab(s) orally 3 times a day (with meals)   omeprazole 20 mg oral delayed release capsule  1 cap(s) orally once a day (in the morning)   atorvastatin 40 mg oral tablet  1 tab(s) orally once a day (in the morning)     Physician's Instructions:  Home Health? No   Treatments None   Dressing Care Keep dressing dry   Home Oxygen? No   Diet Regular   Dietary Supplements None   Activity Limitations No weight on left foot or leg   Return to Work Not Applicable   Time frame for Follow Up Appointment 1-2 weeks  Dr. Ether GriffinsFowler this week   Electronic Signatures: Ricci Barkerline, Cailah Reach W (DPM)  (Signed 06-Dec-14 08:12)  Authored: ADMISSION DATE AND DIAGNOSIS, CHIEF COMPLAINT/HPI, PERTINENT RADIOLOGY STUDIES, HOSPITAL COURSE, DISCHARGE INSTRUCTIONS HOME MEDS, PATIENT INSTRUCTIONS   Last Updated: 06-Dec-14 08:12 by Ricci Barkerline, Shaguana Love W (DPM)

## 2015-01-15 NOTE — Discharge Summary (Signed)
PATIENT NAME:  Kelsey Paul, Kelsey Paul MR#:  [id number] DATE OF BIRTH:  1965-12-10  DATE OF ADMISSION:  02/13/2012 DATE OF DISCHARGE:  02/18/2012  ADMITTING DIAGNOSIS: Right total knee arthroplasty.   HOSPITAL COURSE: Ms. Kelsey Paul was admitted to the orthopedic surgery floor after an uncomplicated right total knee arthroplasty by Dr. Juanell FairlyKevin Phillip Sandler. She remained on postoperative antibiotics for 24 hours. On postoperative day number one, she was up out of bed to a chair. Physical and occupational therapy consults were called. The patient had her labs drawn for the first three days postoperative to ensure stable hematocrit and hemoglobin. The patient did not require transfusion while in the hospital. On postoperative day number two, her Foley catheter was removed. Her pain was well controlled and she was walking around the nurse's station. At the end of postoperative day number one, she was started on Lovenox for deep vein thrombosis prophylaxis and continued this throughout her hospitalization. Her dressing was changed by the orthopedic surgeon and continued to be changed daily throughout her hospitalization. She had constipation postoperatively which was treated medically. By postoperative day number five, the patient had passed a bowel movement. She was progressing well with physical therapy and her pain was controlled on oral narcotics. Given her clinical improvement, she was prepared for discharge home. The patient had an uncomplicated course during her hospitalization.   DISCHARGE INSTRUCTIONS: The patient is discharged home with home health and home physical therapy. She should continue partial weight-bearing on the right lower extremity while using her walker. She should have daily dressing changes and keep the incision clean, dry, and intact. The patient will remain on Lovenox 40 mg daily until she follows up with me in my office in two weeks. She will have a two week appointment for wound check and suture  removal. She          was instructed to call my office with any worsening pain, redness, swelling, fevers, or other signs of infection or worsening clinical condition. The patient understood these instructions. She was given a prescription for pain medication. She will restart all of her home medications.  ____________________________ Kathreen DevoidKevin L. Jyssica Rief, MD klk:slb D: 03/03/2012 16:57:00 ET T: 03/03/2012 17:08:44 ET JOB#: [id number]  cc: Kathreen DevoidKevin L. Phinneas Shakoor, MD, <Dictator> Kathreen DevoidKEVIN L Kyo Cocuzza MD ELECTRONICALLY SIGNED 03/04/2012 13:22

## 2015-01-15 NOTE — Op Note (Signed)
PATIENT NAME:  Kelsey Paul, Kelsey Paul MR#:  [id number] DATE OF BIRTH:  12/12/1965  DATE OF PROCEDURE:  02/13/2012  PREOPERATIVE DIAGNOSIS: Right knee osteoarthritis.   POSTOPERATIVE DIAGNOSIS: Right knee osteoarthritis.   PROCEDURE: Right total knee arthroplasty.   SURGEON: Kevin Krasinski, MD   ASSISTANT: Howard Miller, MD  ANESTHESIA: Spinal with femoral nerve block and local with 0.25% Marcaine plain.   TOURNIQUET TIME: 125 minutes.   ESTIMATED BLOOD LOSS: 50 mL.  URINE: 150 mL.  IMPLANTS: DePuy Sigma femoral posterior stabilized cemented size 2 component, a DePuy  tibial rotating platform tray, size 2 cemented, a tibial rotating platform insert, stabilized, 10 mm, size 2, and an oval dome patella three-peg 35 mm diameter.   INDICATIONS FOR PROCEDURE: The patient is a 49-year-old female who has had years of right knee pain. She has severe medial compartment and patellofemoral osteoarthritis. The patient has failed to respond to nonoperative management and wishes to proceed with a right total knee arthroplasty. She has undergone a successful left total knee arthroplasty. The patient understands the risks and benefits of surgery. She understands the risks include infection, bleeding requiring blood transfusion, nerve or blood vessel injury, knee stiffness requiring manipulation or lysis of adhesions, fracture, dislocation, persistent pain knee pain and instability, failure of the components or loosening of the components, and the need for further surgery. The patient also understands medical complications include deep vein thrombosis and pulmonary embolism, myocardial infarction, stroke, pneumonia, respiratory failure, and death.   DESCRIPTION OF PROCEDURE: The patient was met in the preoperative area. Her right leg was signed with the word "yes" within the operative field according to hospital's right site protocol. Her History and Physical was updated. The patient was then seen by the Anesthesia  Service who placed a right femoral nerve block in the surgical holding area. The patient was then brought to the Operating Room where she was placed supine on the operative table. All bony prominences were adequately padded. The patient underwent a spinal anesthetic by the Anesthesia Service prior to patient positioning. Once the patient was positioned supine on the table, and all bony prominences were padded, and she was prepped and draped in a sterile fashion.   A timeout was performed to verify the patient's name, date of birth, medical record number, correct site of surgery, and correct procedure to be performed. It was also used to verify the patient had received antibiotics and that all appropriate instruments, implants, and radiographic studies were available in the room. Once all in attendance were in agreement, the case began. The patient had a tourniquet applied to her right upper thigh. This was inflated to 275 mmHg for a total of 125 minutes. A proposed incision was drawn out with a surgical marker based upon bony landmarks. A #10 blade was used to make a longitudinal incision. Full thickness medial and lateral skin flaps were created. Then a medial arthrotomy was created with a deep #10 blade. The patella was everted and the knee was flexed. The medial and lateral menisci were debrided along with the cruciate ligaments. A drill was then used to enter the femoral canal to allow positioning of the distal femoral cutting guide. This was set for a 9 mm distal cut and pinned into position. An oscillating saw was then used to create the distal femoral cut. The attention was then turned to preparation of a proximal tibial plateau.   An external tibial alignment guide was placed over the anterior tibia. It was positioned to take approximately   a 2 mm cut off the proximal tibia. There was not significant bone loss seen. Once the proximal tibial osteotomy was created, the extension gap was measured and found  to be 10 mm after a medial soft tissue release. Osteophytes were also removed off the medial side to allow for symmetric extension gap spacing.   The knee was then flexed and the attention was turned back to femur preparation. A distal femoral sizing guide was then positioned over the distal femur. The distal femur was measured to be a size 2. The appropriate adjustments were made to the sizing guide and two pins were drilled into position. These were found to be in line with the epicondylar axis and were the appropriate amount of external rotation. Next, the size 2 distal femoral cutting guide was gently malleted into position. The anterior, posterior, and chamfer cuts were then made. The cutting guide was then removed, and the 2 right femoral trial prosthesis was placed over the distal femur and found to have an excellent fit. The attention was then turned back to the tibia.   A size 2 proximal tibial tray had the best coverage of the proximal tibia. This was pinned into position. The drill tower was then positioned on top of the tray and the keel was drilled. A flanged punch was then used to create the flanges on the tibial keel. Once the size 2 tibial trial with 10 mm insert was then placed into position and the knee was reduced, it was taken through a full range of motion and found to have excellent motion, including full extension. There was symmetric gap spacing both in extension and flexion.   Finally, the attention was turned to preparation of the patella. This was measured to be 35 mm in diameter. A patellar osteotomy was performed leaving a width of between 13 and 14 mm. Three peg holes were drilled and the trial patella positioned. The knee was brought through a range of motion. The patella was not found to subluxate or dislocate. All trial instruments were then removed. The knee was then copiously irrigated. Dry sponges were used to dry all bone surfaces. The methylmethacrylate was mixed on the  back table and then put on all bone surfaces. The actual tibial prosthesis was then malleted into position and excess methyl methacrylate was removed. Second, the femoral trial was then cemented into position and malleted into place. A trial size 10 posterior stabilized tibial tray was then placed into position and the knee brought into full extension with an extension force applied as the methylmethacrylate cured. Finally, methylmethacrylate was placed under the patellar component, and this was placed and held into position with a clamp. After the methylmethacrylate had completely cured, the patellar clamp was removed. The trial size 10 insert was also removed after it was found to have excellent stability. An actual size 10 posterior stabilized rotating platform tray was then placed and the knee was reduced. The joint was copiously irrigated. The capsule was injected with 0.25% Marcaine for postoperative pain relief. An Autovac was then placed with the tubes exiting the superior lateral aspect of the knee. The medial arthrotomy was closed using #2 Tycron interrupted sutures. The deep layer was closed with 0 Vicryl and the subcutaneous layer closed with 2-0 Vicryl. The skin was closed using a running 3-0 Monocryl. Steri-Strips were applied along with a dry sterile dressing. The patient had a Polar Care sleeve applied to the right knee along with a knee immobilizer. She was brought   to the PAC-U in stable condition. I was scrubbed and present for the entire case, and all sharp and instrument counts were correct at the conclusion of the case. The patient will be admitted for postoperative total knee arthroplasty management on my service.  ____________________________ Kevin L. Krasinski, MD klk:cbb D: 02/16/2012 11:22:05 ET T: 02/16/2012 11:48:15 ET JOB#: [id number]  cc: Kevin L. Krasinski, MD, <Dictator> KEVIN L KRASINSKI MD ELECTRONICALLY SIGNED 02/21/2012 17:02
